# Patient Record
Sex: Female | Born: 1975 | Race: White | Hispanic: No | Marital: Single | Smoking: Never smoker
Health system: Southern US, Community
[De-identification: ages and names within clinical notes are randomized; demographics above are authoritative.]

## PROBLEM LIST (undated history)

## (undated) DIAGNOSIS — F329 Major depressive disorder, single episode, unspecified: Secondary | ICD-10-CM

## (undated) DIAGNOSIS — F419 Anxiety disorder, unspecified: Secondary | ICD-10-CM

## (undated) DIAGNOSIS — I10 Essential (primary) hypertension: Secondary | ICD-10-CM

## (undated) DIAGNOSIS — E7800 Pure hypercholesterolemia, unspecified: Secondary | ICD-10-CM

## (undated) DIAGNOSIS — F32A Depression, unspecified: Secondary | ICD-10-CM

## (undated) HISTORY — PX: CHOLECYSTECTOMY: SHX55

## (undated) HISTORY — PX: TUBAL LIGATION: SHX77

---

## 2004-12-28 ENCOUNTER — Emergency Department (HOSPITAL_COMMUNITY): Admission: EM | Admit: 2004-12-28 | Discharge: 2004-12-28 | Payer: Self-pay | Admitting: Emergency Medicine

## 2005-01-25 ENCOUNTER — Emergency Department (HOSPITAL_COMMUNITY): Admission: EM | Admit: 2005-01-25 | Discharge: 2005-01-26 | Payer: Self-pay | Admitting: Emergency Medicine

## 2005-03-07 ENCOUNTER — Inpatient Hospital Stay (HOSPITAL_COMMUNITY): Admission: AD | Admit: 2005-03-07 | Discharge: 2005-03-07 | Payer: Self-pay | Admitting: Obstetrics and Gynecology

## 2005-03-09 ENCOUNTER — Encounter: Admission: RE | Admit: 2005-03-09 | Discharge: 2005-03-09 | Payer: Self-pay | Admitting: Obstetrics and Gynecology

## 2005-05-22 ENCOUNTER — Emergency Department (HOSPITAL_COMMUNITY): Admission: EM | Admit: 2005-05-22 | Discharge: 2005-05-23 | Payer: Self-pay | Admitting: Emergency Medicine

## 2005-08-31 ENCOUNTER — Encounter: Admission: RE | Admit: 2005-08-31 | Discharge: 2005-08-31 | Payer: Self-pay | Admitting: Obstetrics and Gynecology

## 2005-09-19 ENCOUNTER — Emergency Department (HOSPITAL_COMMUNITY): Admission: EM | Admit: 2005-09-19 | Discharge: 2005-09-20 | Payer: Self-pay | Admitting: *Deleted

## 2006-03-20 ENCOUNTER — Emergency Department (HOSPITAL_COMMUNITY): Admission: EM | Admit: 2006-03-20 | Discharge: 2006-03-20 | Payer: Self-pay | Admitting: Emergency Medicine

## 2011-05-29 ENCOUNTER — Emergency Department (HOSPITAL_COMMUNITY)
Admission: EM | Admit: 2011-05-29 | Discharge: 2011-05-29 | Disposition: A | Discharge status: Home / Self Care | Payer: Self-pay | Attending: Emergency Medicine | Admitting: Emergency Medicine

## 2011-05-29 ENCOUNTER — Emergency Department (HOSPITAL_COMMUNITY): Payer: Self-pay

## 2011-05-29 DIAGNOSIS — E669 Obesity, unspecified: Secondary | ICD-10-CM | POA: Insufficient documentation

## 2011-05-29 DIAGNOSIS — R5381 Other malaise: Secondary | ICD-10-CM | POA: Insufficient documentation

## 2011-05-29 DIAGNOSIS — R209 Unspecified disturbances of skin sensation: Secondary | ICD-10-CM | POA: Insufficient documentation

## 2011-05-29 DIAGNOSIS — E7800 Pure hypercholesterolemia, unspecified: Secondary | ICD-10-CM | POA: Insufficient documentation

## 2011-05-29 DIAGNOSIS — Z6830 Body mass index (BMI) 30.0-30.9, adult: Secondary | ICD-10-CM | POA: Insufficient documentation

## 2011-05-29 DIAGNOSIS — R5383 Other fatigue: Secondary | ICD-10-CM | POA: Insufficient documentation

## 2011-05-29 DIAGNOSIS — R200 Anesthesia of skin: Secondary | ICD-10-CM

## 2011-05-29 DIAGNOSIS — Z79899 Other long term (current) drug therapy: Secondary | ICD-10-CM | POA: Insufficient documentation

## 2011-05-29 DIAGNOSIS — I10 Essential (primary) hypertension: Secondary | ICD-10-CM | POA: Insufficient documentation

## 2011-05-29 HISTORY — DX: Essential (primary) hypertension: I10

## 2011-05-29 HISTORY — DX: Pure hypercholesterolemia, unspecified: E78.00

## 2011-05-29 LAB — URINALYSIS, ROUTINE W REFLEX MICROSCOPIC
Bilirubin Urine: NEGATIVE
Glucose, UA: NEGATIVE mg/dL
Hgb urine dipstick: NEGATIVE
Ketones, ur: NEGATIVE mg/dL
Leukocytes, UA: NEGATIVE
Nitrite: NEGATIVE
Protein, ur: NEGATIVE mg/dL
Specific Gravity, Urine: 1.025 (ref 1.005–1.030)
Urobilinogen, UA: 0.2 mg/dL (ref 0.0–1.0)
pH: 6 (ref 5.0–8.0)

## 2011-05-29 LAB — BASIC METABOLIC PANEL
BUN: 13 mg/dL (ref 6–23)
CO2: 27 mEq/L (ref 19–32)
Calcium: 9.8 mg/dL (ref 8.4–10.5)
Chloride: 100 mEq/L (ref 96–112)
Creatinine, Ser: 0.52 mg/dL (ref 0.50–1.10)
GFR calc Af Amer: >90 mL/min (ref >90)
GFR calc non Af Amer: >90 mL/min (ref >90)
Glucose, Bld: 139 mg/dL — ABNORMAL HIGH (ref 70–99)
Potassium: 3.8 mEq/L (ref 3.5–5.1)
Sodium: 136 mEq/L (ref 135–145)

## 2011-05-29 LAB — CBC
HCT: 35.8 % — ABNORMAL LOW (ref 36.0–46.0)
Hemoglobin: 11.9 g/dL — ABNORMAL LOW (ref 12.0–15.0)
MCH: 29.6 pg (ref 26.0–34.0)
MCHC: 33.2 g/dL (ref 30.0–36.0)
MCV: 89.1 fL (ref 78.0–100.0)
Platelets: 313 10*3/uL (ref 150–400)
RBC: 4.02 MIL/uL (ref 3.87–5.11)
RDW: 13.9 % (ref 11.5–15.5)
WBC: 11.1 10*3/uL — ABNORMAL HIGH (ref 4.0–10.5)

## 2011-05-29 LAB — PREGNANCY, URINE: Preg Test, Ur: NEGATIVE

## 2011-05-29 NOTE — ED Notes (Signed)
C.o left side weakness,numbness for 3 months per pt. Pt ambulated to triage without difficulty. A/o x3. resp even/nonlabored. Face symmetrical.

## 2011-05-29 NOTE — ED Notes (Signed)
Pt refused wheelchair, ambulatory at discharge

## 2011-05-29 NOTE — ED Provider Notes (Signed)
History    35yF with numbness. L face upper and lower extremities and R elbow. Gradual onset about 3 months ago. Persistent. Relatively constant symptoms. No HA. No visual complaints. No fever or chills. No n/v. Denies trauma. No neck pain or stiffness. No rash. No hx of previous similar symptoms.   CSN: [id number]  Arrival date & time 05/29/11  1224   First MD Initiated Contact with Patient 05/29/11 1403      Chief Complaint  Patient presents with  . Leg Pain  . Weakness  . Numbness    (Consider location/radiation/quality/duration/timing/severity/associated sxs/prior treatment) HPI  Past Medical History  Diagnosis Date  . Diabetes mellitus   . Hypertension   . Hypercholesteremia     Past Surgical History  Procedure Date  . Cholecystectomy   . Tubal ligation     No family history on file.  History  Substance Use Topics  . Smoking status: Never Smoker   . Smokeless tobacco: Not on file  . Alcohol Use: No    OB History    Grav Para Term Preterm Abortions TAB SAB Ect Mult Living                  Review of Systems   Review of symptoms negative unless otherwise noted in HPI.   Allergies  Lidocaine  Home Medications   Current Outpatient Rx  Name Route Sig Dispense Refill  . LORATADINE 10 MG PO TABS Oral Take 10 mg by mouth daily.      Marland Kitchen METFORMIN HCL 500 MG PO TABS Oral Take 500 mg by mouth 2 (two) times daily with a meal.      . OLMESARTAN MEDOXOMIL-HCTZ 20-12.5 MG PO TABS Oral Take 1 tablet by mouth daily.      . OMEGA-3-ACID ETHYL ESTERS 1 G PO CAPS Oral Take by mouth 2 (two) times daily.      . SERTRALINE HCL 50 MG PO TABS Oral Take 50 mg by mouth daily.        BP 126/83  Pulse 91  Temp(Src) 98.6 F (37 C) (Oral)  Resp 18  Ht 5\' 4"  (1.626 m)  Wt 280 lb (127.007 kg)  BMI 48.06 kg/m2  SpO2 97%  LMP 05/20/2011  Physical Exam  Nursing note and vitals reviewed. Constitutional: She is oriented to person, place, and time. No distress.   Sitting up in bed. No acute distress. Obese.  HENT:  Head: Normocephalic and atraumatic.  Eyes: Conjunctivae are normal. Right eye exhibits no discharge. Left eye exhibits no discharge.  Neck: Normal range of motion. Neck supple.  Cardiovascular: Normal rate, regular rhythm and normal heart sounds.  Exam reveals no gallop and no friction rub.   No murmur heard. Pulmonary/Chest: Effort normal and breath sounds normal. No respiratory distress.  Abdominal: Soft. She exhibits no distension. There is no tenderness.  Musculoskeletal: She exhibits no edema and no tenderness.  Lymphadenopathy:    She has no cervical adenopathy.  Neurological: She is alert and oriented to person, place, and time. No cranial nerve deficit. She exhibits normal muscle tone. Coordination normal.  Skin: Skin is warm and dry. No rash noted. She is not diaphoretic.  Psychiatric: She has a normal mood and affect. Her behavior is normal. Thought content normal.    ED Course  Procedures (including critical care time)  Labs Reviewed  BASIC METABOLIC PANEL - Abnormal; Notable for the following:    Glucose, Bld 139 (*)    All other components within normal  limits  CBC - Abnormal; Notable for the following:    WBC 11.1 (*)    Hemoglobin 11.9 (*)    HCT 35.8 (*)    All other components within normal limits  URINALYSIS, ROUTINE W REFLEX MICROSCOPIC  PREGNANCY, URINE   Ct Head Wo Contrast  05/29/2011  *RADIOLOGY REPORT*  Clinical Data: Leg pain, weakness  CT HEAD WITHOUT CONTRAST  Technique:  Contiguous axial images were obtained from the base of the skull through the vertex without contrast.  Comparison: None.  Findings: No skull fracture is noted.  The mastoid air cells are unremarkable.  Mild nodular mucosal thickening medial aspect of the left maxillary sinus.  Measures about 6 mm.  No intracranial hemorrhage, mass effect or midline shift.  No acute infarction.  No hydrocephalus.  No intra or extra-axial fluid collection.   The gray and white matter differentiation is preserved.  IMPRESSION: No acute intracranial abnormality.  Mild nodular mucosal thickening medial aspect of the left maxillary sinus.  Original Report Authenticated By: Natasha Mead, M.D.   5:47 PM Discussed findings with pt. Understands need to follow-up. Questions answered to her satisfaction. Requesting work note.  1. Numbness   2. Obesity (BMI 30-39.9)       MDM  35yF with numbness L side and R elbow. Consider CVA, peripheral nerve problem, MS, nutritional, tox.  Distribution not consistent with infarct unless multiple. Possible MS. Less likely tox or nutritional. CT negative for acute process. Feel pt appropriate for outpt fu for further wu.        Raeford Razor, MD 05/29/11 [phone]

## 2013-12-21 ENCOUNTER — Emergency Department (HOSPITAL_COMMUNITY): Payer: Medicaid - Out of State

## 2013-12-21 ENCOUNTER — Encounter (HOSPITAL_COMMUNITY): Payer: Self-pay | Admitting: Emergency Medicine

## 2013-12-21 ENCOUNTER — Emergency Department (HOSPITAL_COMMUNITY)
Admission: EM | Admit: 2013-12-21 | Discharge: 2013-12-22 | Disposition: A | Discharge status: Home / Self Care | Payer: Medicaid - Out of State | Attending: Emergency Medicine | Admitting: Emergency Medicine

## 2013-12-21 DIAGNOSIS — J029 Acute pharyngitis, unspecified: Secondary | ICD-10-CM | POA: Diagnosis not present

## 2013-12-21 DIAGNOSIS — I10 Essential (primary) hypertension: Secondary | ICD-10-CM | POA: Diagnosis not present

## 2013-12-21 DIAGNOSIS — J40 Bronchitis, not specified as acute or chronic: Secondary | ICD-10-CM

## 2013-12-21 DIAGNOSIS — R0602 Shortness of breath: Secondary | ICD-10-CM | POA: Diagnosis not present

## 2013-12-21 DIAGNOSIS — Z79899 Other long term (current) drug therapy: Secondary | ICD-10-CM | POA: Insufficient documentation

## 2013-12-21 DIAGNOSIS — E7800 Pure hypercholesterolemia, unspecified: Secondary | ICD-10-CM | POA: Diagnosis not present

## 2013-12-21 DIAGNOSIS — R05 Cough: Secondary | ICD-10-CM | POA: Insufficient documentation

## 2013-12-21 DIAGNOSIS — H9209 Otalgia, unspecified ear: Secondary | ICD-10-CM | POA: Diagnosis not present

## 2013-12-21 DIAGNOSIS — J209 Acute bronchitis, unspecified: Secondary | ICD-10-CM | POA: Diagnosis not present

## 2013-12-21 DIAGNOSIS — E119 Type 2 diabetes mellitus without complications: Secondary | ICD-10-CM | POA: Diagnosis not present

## 2013-12-21 DIAGNOSIS — R079 Chest pain, unspecified: Secondary | ICD-10-CM | POA: Insufficient documentation

## 2013-12-21 DIAGNOSIS — R059 Cough, unspecified: Secondary | ICD-10-CM | POA: Insufficient documentation

## 2013-12-21 LAB — I-STAT CHEM 8, ED
BUN: 8 mg/dL (ref 6–23)
CHLORIDE: 101 meq/L (ref 96–112)
Calcium, Ion: 1.03 mmol/L — ABNORMAL LOW (ref 1.12–1.23)
Creatinine, Ser: 0.9 mg/dL (ref 0.50–1.10)
GLUCOSE: 127 mg/dL — AB (ref 70–99)
HEMATOCRIT: 33 % — AB (ref 36.0–46.0)
Hemoglobin: 11.2 g/dL — ABNORMAL LOW (ref 12.0–15.0)
POTASSIUM: 3.4 meq/L — AB (ref 3.7–5.3)
Sodium: 135 mEq/L — ABNORMAL LOW (ref 137–147)
TCO2: 24 mmol/L (ref 0–100)

## 2013-12-21 LAB — D-DIMER, QUANTITATIVE: D-Dimer, Quant: 1.82 ug/mL-FEU — ABNORMAL HIGH (ref 0.00–0.48)

## 2013-12-21 MED ORDER — IPRATROPIUM-ALBUTEROL 0.5-2.5 (3) MG/3ML IN SOLN
3.0000 mL | Freq: Once | RESPIRATORY_TRACT | Status: AC
  Administered 2013-12-21: 3 mL via RESPIRATORY_TRACT
  Filled 2013-12-21: qty 3

## 2013-12-21 MED ORDER — ALBUTEROL SULFATE HFA 108 (90 BASE) MCG/ACT IN AERS: 2.0000 | INHALATION_SPRAY | Freq: Four times a day (QID) | RESPIRATORY_TRACT | Status: AC | PRN

## 2013-12-21 MED ORDER — AZITHROMYCIN 250 MG PO TABS: 250.0000 mg | ORAL_TABLET | Freq: Every day | ORAL | Status: AC

## 2013-12-21 MED ORDER — IOHEXOL 350 MG/ML SOLN
100.0000 mL | Freq: Once | INTRAVENOUS | Status: AC | PRN
  Administered 2013-12-21: 100 mL via INTRAVENOUS

## 2013-12-21 NOTE — ED Notes (Signed)
Pt c/o cough with chest soreness x 3 days.

## 2013-12-21 NOTE — ED Notes (Signed)
Patient states that she still feels like when she first presented to ED. States that it hurts when she coughs. States that she is having some pain in upper part of back between shoulder blades on both sides.

## 2013-12-21 NOTE — ED Provider Notes (Signed)
CSN: [id number]     Arrival date & time 12/21/13  2013 History  This chart was scribed for Renee Octave, MD,  by Ashley Jacobs, ED Scribe. The patient was seen in room APA17/AP[facility]17 and the patient's care was started at 8:29 PM.    First MD Initiated Contact with Patient 12/21/13 2028     Chief Complaint  Patient presents with  . Cough     (Consider location/radiation/quality/duration/timing/severity/associated sxs/prior Treatment) Patient is a 38 y.o. female presenting with cough. The history is provided by the patient and medical records. No language interpreter was used.  Cough Associated symptoms: chest pain, ear pain and sore throat   Associated symptoms: no fever    HPI Comments: Renee Vasquez is a 38 y.o. female with hx of HTN, hypercholesteremia, and DM, presents to the Emergency Department complaining of dry cough and SOB, onset three days ago. She has the associated symptom of sore throat and ear pain. Denies fever.  Pt feels dehydrated. She has chest pain with cough. Pt mentions having chest pain that is mostly constant and radiates to the right side of her neck.  Pt had sick contact. She explains that she is unable to eat due to sore throat. Pt is currently using the Mirena as birth control. Pt has never smoke. Denies asthama and bronchitis. She had asthma as a child. Pt has hx of sleep apnea. Pt has tried Dayquil and Nyquil. Past Medical History  Diagnosis Date  . Diabetes mellitus   . Hypertension   . Hypercholesteremia    Past Surgical History  Procedure Laterality Date  . Cholecystectomy    . Tubal ligation     History reviewed. No pertinent family history. History  Substance Use Topics  . Smoking status: Never Smoker   . Smokeless tobacco: Not on file  . Alcohol Use: No   OB History   Grav Para Term Preterm Abortions TAB SAB Ect Mult Living                 Review of Systems  Constitutional: Negative for fever.  HENT: Positive for ear pain, sore throat  and trouble swallowing.   Respiratory: Positive for cough.   Cardiovascular: Positive for chest pain.  Gastrointestinal: Negative for nausea, vomiting and abdominal pain.  Genitourinary: Negative for menstrual problem.  Musculoskeletal: Negative for back pain.  All other systems reviewed and are negative.     Allergies  Lidocaine and Percocet  Home Medications   Prior to Admission medications   Medication Sig Start Date End Date Taking? Authorizing Provider  atorvastatin (LIPITOR) 10 MG tablet Take 10 mg by mouth at bedtime.   Yes Historical Provider, MD  buPROPion (WELLBUTRIN SR) 150 MG 12 hr tablet Take 150 mg by mouth 2 (two) times daily.   Yes Historical Provider, MD  ferrous sulfate 325 (65 FE) MG tablet Take 325 mg by mouth at bedtime.   Yes Historical Provider, MD  hydrOXYzine (VISTARIL) 50 MG capsule Take 100 mg by mouth at bedtime as needed and may repeat dose one time if needed for anxiety or itching (AND/OR SLEEP).   Yes Historical Provider, MD  lisinopril-hydrochlorothiazide (PRINZIDE,ZESTORETIC) 20-25 MG per tablet Take 1 tablet by mouth every morning.   Yes Historical Provider, MD  metFORMIN (GLUCOPHAGE) 500 MG tablet Take 500 mg by mouth 2 (two) times daily with a meal.     Yes Historical Provider, MD  Pseudoeph-Doxylamine-DM-APAP (DAYQUIL/NYQUIL COLD/FLU RELIEF PO) Take 2 capsules by mouth 2 (two) times daily as  needed (FOR COLD AND COUGH SYMPTOMS).   Yes Historical Provider, MD  traZODone (DESYREL) 150 MG tablet Take 50-150 mg by mouth at bedtime as needed for sleep.   Yes Historical Provider, MD  albuterol (PROVENTIL HFA;VENTOLIN HFA) 108 (90 BASE) MCG/ACT inhaler Inhale 2 puffs into the lungs every 6 (six) hours as needed for wheezing or shortness of breath. 12/21/13   Renee OctaveStephen Saamiya Jeppsen, MD  azithromycin (ZITHROMAX) 250 MG tablet Take 1 tablet (250 mg total) by mouth daily. Take first 2 tablets together, then 1 every day until finished. 12/21/13   Renee OctaveStephen Maudene Stotler, MD   BP  151/72  Pulse 55  Temp(Src) 99.3 F (37.4 C) (Oral)  Resp 22  Ht 5' 4.5" (1.638 m)  Wt 280 lb (127.007 kg)  BMI 47.34 kg/m2  SpO2 100% Physical Exam  Nursing note and vitals reviewed. Constitutional: She is oriented to person, place, and time. She appears well-developed and well-nourished. No distress.  HENT:  Head: Normocephalic and atraumatic.  Mouth/Throat: Oropharynx is clear and moist. No oropharyngeal exudate.  Ears: Serous effusion bilaterally  Eyes: Conjunctivae and EOM are normal. Pupils are equal, round, and reactive to light.  Neck: Normal range of motion. Neck supple.  No meningismus.  Cardiovascular: Normal rate, regular rhythm, normal heart sounds and intact distal pulses.   No murmur heard. Pulmonary/Chest: Effort normal. No respiratory distress. She has wheezes (expiratory ). She exhibits no tenderness.  Dry coughs  Abdominal: Soft. There is no tenderness. There is no rebound and no guarding.  Musculoskeletal: Normal range of motion. She exhibits no edema and no tenderness.  Neurological: She is alert and oriented to person, place, and time. No cranial nerve deficit. She exhibits normal muscle tone. Coordination normal.  No ataxia on finger to nose bilaterally. No pronator drift. 5/5 strength throughout. CN 2-12 intact. Negative Romberg. Equal grip strength. Sensation intact. Gait is normal.   Skin: Skin is warm.  Psychiatric: She has a normal mood and affect. Her behavior is normal.    ED Course  Procedures (including critical care time) DIAGNOSTIC STUDIES: Oxygen Saturation is 99% on room air, normal by my interpretation.    COORDINATION OF CARE:  8:33 PM Discussed course of care with pt . Pt understands and agrees.   Labs Review Labs Reviewed  D-DIMER, QUANTITATIVE - Abnormal; Notable for the following:    D-Dimer, Quant 1.82 (*)    All other components within normal limits  I-STAT CHEM 8, ED - Abnormal; Notable for the following:    Sodium 135 (*)     Potassium 3.4 (*)    Glucose, Bld 127 (*)    Calcium, Ion 1.03 (*)    Hemoglobin 11.2 (*)    HCT 33.0 (*)    All other components within normal limits    Imaging Review Dg Chest 2 View  12/21/2013   CLINICAL DATA:  Cough and headaches for 1 week.  EXAM: CHEST  2 VIEW  COMPARISON:  None.  FINDINGS: The heart size and mediastinal contours are within normal limits. Both lungs are clear. The visualized skeletal structures are unremarkable.  IMPRESSION: No active cardiopulmonary disease.   Electronically Signed   By: Burman NievesWilliam  Stevens M.D.   On: 12/21/2013 21:25   Ct Angio Chest Pe W/cm &/or Wo Cm  12/21/2013   CLINICAL DATA:  Chest pain, shortness of breath, and cough.  EXAM: CT ANGIOGRAPHY CHEST WITH CONTRAST  TECHNIQUE: Multidetector CT imaging of the chest was performed using the standard protocol during bolus administration  of intravenous contrast. Multiplanar CT image reconstructions and MIPs were obtained to evaluate the vascular anatomy.  CONTRAST:  OMNIPAQUE IOHEXOL 350 MG/ML SOLN  COMPARISON:  None.  FINDINGS: Technically adequate study with moderately good opacification of the central and segmental pulmonary arteries. No focal filling defects demonstrated. No evidence of significant pulmonary embolus.  Normal heart size. Normal caliber thoracic aorta without evidence of dissection. 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 The esophagus is decompressed. No significant lymphadenopathy in the chest. Visualization of the lungs is limited due to motion artifact but no gross focal consolidation or airspace disease is identified. No pleural effusion. No pneumothorax.  Visualized portions of the upper abdominal organs demonstrate diffuse fatty infiltration and associated enlargement of the liver. No destructive bone lesions appreciated.  Review of the MIP images confirms the above findings.  IMPRESSION:  No evidence of significant pulmonary embolus.   Electronically Signed   By: Burman Nieves M.D.   On: 12/21/2013 23:30     EKG Interpretation   Date/Time:  Monday December 21 2013 20:48:49 EDT Ventricular Rate:  99 PR Interval:  161 QRS Duration: 97 QT Interval:  340 QTC Calculation: 436 R Axis:   40 Text Interpretation:  Sinus tachycardia Paired ventricular premature  complexes Abnormal R-wave progression, early transition Borderline T wave  abnormalities No previous ECGs available Confirmed by Tamyia Minich  MD, Ellisha Bankson  [phone]) on 12/21/2013 9:24:04 PM      MDM   Final diagnoses:  Bronchitis   3 history dry cough, shortness of breath, chest soreness with coughing. No fever. No sick contacts. Does not smoke.  No distress. Scattered expiratory wheezing. No hypoxia. Chest x-ray is clear. D-dimer elevated.  CTPE negative.  We'll treat for bronchitis with azithromycin, bronchodilators. Followup with PCP.   I personally performed the services described in this documentation, which was scribed in my presence. The recorded information has been reviewed and is accurate.     Renee Octave, MD 12/22/13 [phone]

## 2013-12-21 NOTE — Discharge Instructions (Signed)

## 2017-07-12 ENCOUNTER — Encounter (HOSPITAL_COMMUNITY): Payer: Self-pay | Admitting: Emergency Medicine

## 2017-07-12 ENCOUNTER — Emergency Department (HOSPITAL_COMMUNITY): Payer: Medicaid - Out of State

## 2017-07-12 ENCOUNTER — Other Ambulatory Visit: Payer: Self-pay

## 2017-07-12 ENCOUNTER — Emergency Department (HOSPITAL_COMMUNITY)
Admission: EM | Admit: 2017-07-12 | Discharge: 2017-07-13 | Disposition: A | Discharge status: Home / Self Care | Payer: Medicaid - Out of State | Attending: Emergency Medicine | Admitting: Emergency Medicine

## 2017-07-12 DIAGNOSIS — M533 Sacrococcygeal disorders, not elsewhere classified: Secondary | ICD-10-CM | POA: Diagnosis not present

## 2017-07-12 DIAGNOSIS — M4696 Unspecified inflammatory spondylopathy, lumbar region: Secondary | ICD-10-CM | POA: Insufficient documentation

## 2017-07-12 DIAGNOSIS — E119 Type 2 diabetes mellitus without complications: Secondary | ICD-10-CM | POA: Diagnosis not present

## 2017-07-12 DIAGNOSIS — Z79899 Other long term (current) drug therapy: Secondary | ICD-10-CM | POA: Diagnosis not present

## 2017-07-12 DIAGNOSIS — M47816 Spondylosis without myelopathy or radiculopathy, lumbar region: Secondary | ICD-10-CM

## 2017-07-12 DIAGNOSIS — R200 Anesthesia of skin: Secondary | ICD-10-CM | POA: Diagnosis not present

## 2017-07-12 DIAGNOSIS — M461 Sacroiliitis, not elsewhere classified: Secondary | ICD-10-CM

## 2017-07-12 DIAGNOSIS — Z7984 Long term (current) use of oral hypoglycemic drugs: Secondary | ICD-10-CM | POA: Diagnosis not present

## 2017-07-12 DIAGNOSIS — I10 Essential (primary) hypertension: Secondary | ICD-10-CM | POA: Insufficient documentation

## 2017-07-12 DIAGNOSIS — M47818 Spondylosis without myelopathy or radiculopathy, sacral and sacrococcygeal region: Secondary | ICD-10-CM

## 2017-07-12 HISTORY — DX: Anxiety disorder, unspecified: F41.9

## 2017-07-12 HISTORY — DX: Depression, unspecified: F32.A

## 2017-07-12 HISTORY — DX: Major depressive disorder, single episode, unspecified: F32.9

## 2017-07-12 MED ORDER — IBUPROFEN 800 MG PO TABS
800.0000 mg | ORAL_TABLET | Freq: Once | ORAL | Status: DC
  Filled 2017-07-12: qty 1

## 2017-07-12 MED ORDER — ACETAMINOPHEN 325 MG PO TABS
650.0000 mg | ORAL_TABLET | Freq: Once | ORAL | Status: AC
  Administered 2017-07-12: 650 mg via ORAL
  Filled 2017-07-12: qty 2

## 2017-07-12 NOTE — ED Provider Notes (Signed)
[facility] EMERGENCY DEPARTMENT Provider Note   CSN: [id number] Arrival date & time: 07/12/17  2049     History   Chief Complaint Chief Complaint  Patient presents with  . Back Pain    HPI Renee Vasquez is a 42 y.o. female.  PCP: [facility].   The history is provided by the patient.  Back Pain   This is a new problem. The current episode started 12 to 24 hours ago. The problem occurs hourly. The problem has been gradually worsening. The pain is associated with no known injury. The pain is present in the lumbar spine. The quality of the pain is described as burning and aching. Radiates to: right buttock. The pain is moderate. Exacerbated by: standing. The pain is the same all the time. Associated symptoms include numbness. Pertinent negatives include no chest pain, no fever, no abdominal pain, no bowel incontinence, no perianal numbness, no bladder incontinence and no dysuria. She has tried nothing for the symptoms.    Past Medical History:  Diagnosis Date  . Anxiety   . Depression   . Diabetes mellitus   . Hypercholesteremia   . Hypertension     There are no active problems to display for this patient.   Past Surgical History:  Procedure Laterality Date  . CHOLECYSTECTOMY    . TUBAL LIGATION      OB History    No data available       Home Medications    Prior to Admission medications   Medication Sig Start Date End Date Taking? Authorizing Provider  albuterol (PROVENTIL HFA;VENTOLIN HFA) 108 (90 BASE) MCG/ACT inhaler Inhale 2 puffs into the lungs every 6 (six) hours as needed for wheezing or shortness of breath. 12/21/13   Rancour, Jeannett Senior, MD  atorvastatin (LIPITOR) 10 MG tablet Take 10 mg by mouth at bedtime.    [provider]  azithromycin (ZITHROMAX) 250 MG tablet Take 1 tablet (250 mg total) by mouth daily. Take first 2 tablets together, then 1 every day until finished. 12/21/13   Rancour, Jeannett Senior, MD  buPROPion  (WELLBUTRIN SR) 150 MG 12 hr tablet Take 150 mg by mouth 2 (two) times daily.    [provider]  ferrous sulfate 325 (65 FE) MG tablet Take 325 mg by mouth at bedtime.    [provider]  hydrOXYzine (VISTARIL) 50 MG capsule Take 100 mg by mouth at bedtime as needed and may repeat dose one time if needed for anxiety or itching (AND/OR SLEEP).    [provider]  lisinopril-hydrochlorothiazide (PRINZIDE,ZESTORETIC) 20-25 MG per tablet Take 1 tablet by mouth every morning.    [provider]  metFORMIN (GLUCOPHAGE) 500 MG tablet Take 500 mg by mouth 2 (two) times daily with a meal.      [provider]  Pseudoeph-Doxylamine-DM-APAP (DAYQUIL/NYQUIL COLD/FLU RELIEF PO) Take 2 capsules by mouth 2 (two) times daily as needed (FOR COLD AND COUGH SYMPTOMS).    [provider]  traZODone (DESYREL) 150 MG tablet Take 50-150 mg by mouth at bedtime as needed for sleep.    [provider]    Family History No family history on file.  Social History Social History   Tobacco Use  . Smoking status: Never Smoker  . Smokeless tobacco: Never Used  Substance Use Topics  . Alcohol use: No  . Drug use: No     Allergies   Lidocaine; Lisinopril; and Percocet [oxycodone-acetaminophen]   Review of Systems Review of  Systems  Constitutional: Negative for activity change and fever.       All ROS Neg except as noted in HPI  HENT: Negative for nosebleeds.   Eyes: Negative for photophobia and discharge.  Respiratory: Negative for cough, shortness of breath and wheezing.   Cardiovascular: Negative for chest pain and palpitations.  Gastrointestinal: Negative for abdominal pain, blood in stool and bowel incontinence.  Genitourinary: Negative for bladder incontinence, dysuria, frequency and hematuria.  Musculoskeletal: Positive for back pain. Negative for arthralgias and neck pain.  Skin: Negative.   Neurological: Positive for numbness. Negative for  dizziness, seizures and speech difficulty.  Psychiatric/Behavioral: Negative for confusion and hallucinations.     Physical Exam Updated Vital Signs BP 131/73 (BP Location: Right Arm)   Pulse 80   Temp 98.1 F (36.7 C) (Oral)   Resp 16   Ht 5\' 4"  (1.626 m)   Wt 131.1 kg (289 lb)   SpO2 98%   BMI 49.61 kg/m   Physical Exam  Constitutional: She is oriented to person, place, and time. She appears well-developed and well-nourished.  Non-toxic appearance.  HENT:  Head: Normocephalic.  Right Ear: Tympanic membrane and external ear normal.  Left Ear: Tympanic membrane and external ear normal.  Eyes: EOM and lids are normal. Pupils are equal, round, and reactive to light.  Neck: Normal range of motion. Neck supple. Carotid bruit is not present.  Cardiovascular: Normal rate, regular rhythm, normal heart sounds, intact distal pulses and normal pulses.  Pulmonary/Chest: Breath sounds normal. No respiratory distress.  Abdominal: Soft. Bowel sounds are normal. There is no tenderness. There is no guarding.  Musculoskeletal:       Lumbar back: She exhibits pain and spasm.       Back:  Lymphadenopathy:       Head (right side): No submandibular adenopathy present.       Head (left side): No submandibular adenopathy present.    She has no cervical adenopathy.  Neurological: She is alert and oriented to person, place, and time. She has normal strength. No cranial nerve deficit or sensory deficit.  Skin: Skin is warm and dry.  Psychiatric: She has a normal mood and affect. Her speech is normal.  Nursing note and vitals reviewed.    ED Treatments / Results  Labs (all labs ordered are listed, but only abnormal results are displayed) Labs Reviewed - No data to display  EKG  EKG Interpretation None       Radiology No results found.  Procedures Procedures (including critical care time)  Medications Ordered in ED Medications - No data to display   Initial Impression /  Assessment and Plan / ED Course  I have reviewed the triage vital signs and the nursing notes.  Pertinent labs & imaging results that were available during my care of the patient were reviewed by me and considered in my medical decision making (see chart for details).       Final Clinical Impressions(s) / ED Diagnoses MDM No gross neurologic or vascular deficit appreciated. Will obtain CT scan of the lumbar spine.  Tylenol and ibuprofen offered to the patient for assistance with her discomfort.  Patient states she had some ibuprofen earlier and it did not help.  CT scan shows no fracture or malalignment.  There are degenerative changes of the lumbar spine.  There is severe facet arthropathy on the right at the L5-S1 area.  There is no canal stenosis.  There is mild left L3-L4 neural foraminal narrowing.  There is also moderate sacroiliac osteoarthritis.  I discussed these findings with the patient in terms which she understands.  Patient is given prescription for Flexeril and ibuprofen.  I have  asked her to use the heating pad.  Patient is referred to orthopedics for additional evaluation and management.   Final diagnoses:  Lumbar facet arthropathy  Osteoarthritis of right sacroiliac joint    ED Discharge Orders    None       Ivery QualeBryant, Bobbi Yount, PA-C 07/13/17 [phone]    Bethann BerkshireZammit, Joseph, MD 07/14/17 1041

## 2017-07-12 NOTE — ED Triage Notes (Signed)
Pt C/O lower right sided back pain with numbness and burning that started this morning.

## 2017-07-12 NOTE — ED Notes (Signed)
PT states she is not pregnant and states she would like to the CT and not be tested for pregnancy

## 2017-07-13 MED ORDER — HYDROCODONE-ACETAMINOPHEN 5-325 MG PO TABS: 1.0000 | ORAL_TABLET | ORAL | 0 refills | Status: AC | PRN

## 2017-07-13 MED ORDER — CYCLOBENZAPRINE HCL 10 MG PO TABS: 10.0000 mg | ORAL_TABLET | Freq: Three times a day (TID) | ORAL | 0 refills | Status: AC

## 2017-07-13 MED ORDER — IBUPROFEN 600 MG PO TABS: 600.0000 mg | ORAL_TABLET | Freq: Four times a day (QID) | ORAL | 0 refills | Status: AC

## 2017-07-13 NOTE — Discharge Instructions (Signed)
The neurologic and vascular exam are well within normal limits.  The CT scan shows a severe right facet arthropathy (wearing out of the joints behind the vertebrae) at the L5-S1 area.  There is also noted some arthritis in the sacroiliac joints.  Please see Dr. Victorino DikeHewitt, or the orthopedic specialist of your choice as soon as possible for assistance with this problem.  May use Norco every 4 hours if needed tonight.  Please use Flexeril 3 times daily, use ibuprofen with breakfast, lunch, dinner, and at bedtime.  Heating pad to your back may be helpful.

## 2017-07-15 MED FILL — Hydrocodone-Acetaminophen Tab 5-325 MG: ORAL | Qty: 6 | Status: AC

## 2017-09-11 ENCOUNTER — Emergency Department (HOSPITAL_COMMUNITY)
Admission: EM | Admit: 2017-09-11 | Discharge: 2017-09-11 | Disposition: A | Discharge status: Home / Self Care | Payer: Medicaid - Out of State | Attending: Emergency Medicine | Admitting: Emergency Medicine

## 2017-09-11 ENCOUNTER — Emergency Department (HOSPITAL_COMMUNITY): Payer: Medicaid - Out of State

## 2017-09-11 ENCOUNTER — Encounter (HOSPITAL_COMMUNITY): Payer: Self-pay | Admitting: Emergency Medicine

## 2017-09-11 ENCOUNTER — Other Ambulatory Visit: Payer: Self-pay

## 2017-09-11 DIAGNOSIS — Z7984 Long term (current) use of oral hypoglycemic drugs: Secondary | ICD-10-CM | POA: Insufficient documentation

## 2017-09-11 DIAGNOSIS — E119 Type 2 diabetes mellitus without complications: Secondary | ICD-10-CM | POA: Insufficient documentation

## 2017-09-11 DIAGNOSIS — N3000 Acute cystitis without hematuria: Secondary | ICD-10-CM | POA: Diagnosis not present

## 2017-09-11 DIAGNOSIS — I10 Essential (primary) hypertension: Secondary | ICD-10-CM | POA: Diagnosis not present

## 2017-09-11 DIAGNOSIS — Z79899 Other long term (current) drug therapy: Secondary | ICD-10-CM | POA: Insufficient documentation

## 2017-09-11 DIAGNOSIS — R55 Syncope and collapse: Secondary | ICD-10-CM | POA: Diagnosis present

## 2017-09-11 DIAGNOSIS — R42 Dizziness and giddiness: Secondary | ICD-10-CM

## 2017-09-11 LAB — URINALYSIS, ROUTINE W REFLEX MICROSCOPIC
Bilirubin Urine: NEGATIVE
Glucose, UA: NEGATIVE mg/dL
HGB URINE DIPSTICK: NEGATIVE
Ketones, ur: NEGATIVE mg/dL
Nitrite: NEGATIVE
Protein, ur: NEGATIVE mg/dL
SPECIFIC GRAVITY, URINE: 1.011 (ref 1.005–1.030)
pH: 5 (ref 5.0–8.0)

## 2017-09-11 LAB — CBC
HCT: 33.4 % — ABNORMAL LOW (ref 36.0–46.0)
Hemoglobin: 11 g/dL — ABNORMAL LOW (ref 12.0–15.0)
MCH: 30.7 pg (ref 26.0–34.0)
MCHC: 32.9 g/dL (ref 30.0–36.0)
MCV: 93.3 fL (ref 78.0–100.0)
PLATELETS: 305 10*3/uL (ref 150–400)
RBC: 3.58 MIL/uL — AB (ref 3.87–5.11)
RDW: 14.1 % (ref 11.5–15.5)
WBC: 10.5 10*3/uL (ref 4.0–10.5)

## 2017-09-11 LAB — BASIC METABOLIC PANEL
Anion gap: 13 (ref 5–15)
BUN: 20 mg/dL (ref 6–20)
CO2: 26 mmol/L (ref 22–32)
CREATININE: 1.59 mg/dL — AB (ref 0.44–1.00)
Calcium: 8.6 mg/dL — ABNORMAL LOW (ref 8.9–10.3)
Chloride: 97 mmol/L — ABNORMAL LOW (ref 101–111)
GFR calc non Af Amer: 39 mL/min — ABNORMAL LOW (ref >60)
GFR, EST AFRICAN AMERICAN: 45 mL/min — AB (ref >60)
Glucose, Bld: 162 mg/dL — ABNORMAL HIGH (ref 65–99)
Potassium: 3.8 mmol/L (ref 3.5–5.1)
SODIUM: 136 mmol/L (ref 135–145)

## 2017-09-11 LAB — TROPONIN I

## 2017-09-11 MED ORDER — SULFAMETHOXAZOLE-TRIMETHOPRIM 800-160 MG PO TABS
1.0000 | ORAL_TABLET | Freq: Once | ORAL | Status: AC
  Administered 2017-09-11: 1 via ORAL
  Filled 2017-09-11: qty 1

## 2017-09-11 MED ORDER — ONDANSETRON HCL 4 MG/2ML IJ SOLN
4.0000 mg | Freq: Once | INTRAMUSCULAR | Status: AC
  Administered 2017-09-11: 4 mg via INTRAVENOUS
  Filled 2017-09-11: qty 2

## 2017-09-11 MED ORDER — SODIUM CHLORIDE 0.9 % IV BOLUS
1000.0000 mL | Freq: Once | INTRAVENOUS | Status: AC
  Administered 2017-09-11: 1000 mL via INTRAVENOUS

## 2017-09-11 MED ORDER — SULFAMETHOXAZOLE-TRIMETHOPRIM 800-160 MG PO TABS: 1.0000 | ORAL_TABLET | Freq: Two times a day (BID) | ORAL | 0 refills | Status: AC

## 2017-09-11 NOTE — ED Provider Notes (Addendum)
[name] EMERGENCY DEPARTMENT Provider Note   CSN: [id number] Arrival date & time: 09/11/17  1755     History   Chief Complaint Chief Complaint  Patient presents with  . Chest Pain    HPI Renee Vasquez is a 42 y.o. female.  HPI  The patient is a 42 year old female who reports a history of high blood pressure, high cholesterol, diabetes, anxiety and depression.  She has been evaluated multiple times in the past for episodes similar to what occurred today.  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  She was nauseated with this and felt generally weak.  This is happened several times this week and coincides with starting Victoza medication.  She has had these episodes very intermittently in the past and in fact several years ago was admitted to an outside hospital where she was found to have some abnormal tests, she had a heart catheterization that was ultimately performed and she reports that she was told she had normal coronary arteries.  She has not had any heart problems that she knows of, she has not on blood thinners, she has not been having any rectal bleeding but has had some intermittent diarrhea.  She denies dysuria, denies abdominal pain, she has had some coughing over the last week which is intermittent and similar to 2 of her roommates.  She is unsure if she is having any fevers, she denies any tick bites, insect bites, rashes or swelling of her legs that is not chronic.  Electronic medical record reviewed and shows that the patient actually had a nuclear stress test in May 2017 which showed no signs of ischemia  Past Medical History:  Diagnosis Date  . Anxiety   . Depression   . Diabetes mellitus   . Hypercholesteremia   . Hypertension     There are no active problems to display for this  patient.   Past Surgical History:  Procedure Laterality Date  . CHOLECYSTECTOMY    . TUBAL LIGATION       OB History   None      Home Medications    Prior to Admission medications   Medication Sig Start Date End Date Taking? Authorizing Provider  albuterol (PROVENTIL HFA;VENTOLIN HFA) 108 (90 BASE) MCG/ACT inhaler Inhale 2 puffs into the lungs every 6 (six) hours as needed for wheezing or shortness of breath. 12/21/13   Rancour, Jeannett Senior, MD  atorvastatin (LIPITOR) 10 MG tablet Take 10 mg by mouth at bedtime.    [provider]  azithromycin (ZITHROMAX) 250 MG tablet Take 1 tablet (250 mg total) by mouth daily. Take first 2 tablets together, then 1 every day until finished. 12/21/13   Rancour, Jeannett Senior, MD  buPROPion (WELLBUTRIN SR) 150 MG 12 hr tablet Take 150 mg by mouth 2 (two) times daily.    [provider]  cyclobenzaprine (FLEXERIL) 10 MG tablet Take 1 tablet (10 mg total) by mouth 3 (three) times daily. 07/13/17   Ivery Quale, PA-C  ferrous sulfate 325 (65 FE) MG tablet Take 325 mg by mouth at bedtime.    [provider]  HYDROcodone-acetaminophen (NORCO/VICODIN) 5-325 MG tablet Take 1-2 tablets by mouth every 4 (four) hours as needed. 07/13/17   Ivery Quale, PA-C  hydrOXYzine (VISTARIL) 50 MG capsule Take 100 mg by  mouth at bedtime as needed and may repeat dose one time if needed for anxiety or itching (AND/OR SLEEP).    [provider]  ibuprofen (ADVIL,MOTRIN) 600 MG tablet Take 1 tablet (600 mg total) by mouth 4 (four) times daily. 07/13/17   Ivery Quale, PA-C  lisinopril-hydrochlorothiazide (PRINZIDE,ZESTORETIC) 20-25 MG per tablet Take 1 tablet by mouth every morning.    [provider]  metFORMIN (GLUCOPHAGE) 500 MG tablet Take 500 mg by mouth 2 (two) times daily with a meal.      [provider]  Pseudoeph-Doxylamine-DM-APAP (DAYQUIL/NYQUIL COLD/FLU RELIEF PO) Take 2 capsules by mouth 2 (two) times daily as needed  (FOR COLD AND COUGH SYMPTOMS).    [provider]  traZODone (DESYREL) 150 MG tablet Take 50-150 mg by mouth at bedtime as needed for sleep.    [provider]    Family History No family history on file.  Social History Social History   Tobacco Use  . Smoking status: Never Smoker  . Smokeless tobacco: Never Used  Substance Use Topics  . Alcohol use: No  . Drug use: No     Allergies   Lidocaine; Lisinopril; and Percocet [oxycodone-acetaminophen]   Review of Systems Review of Systems  All other systems reviewed and are negative.    Physical Exam Updated Vital Signs BP (!) 95/50   Pulse 66   Temp 98.9 F (37.2 C) (Oral)   Resp 14   Wt 131.1 kg (289 lb)   SpO2 97%   BMI 49.61 kg/m   Physical Exam  Constitutional: She appears well-developed and well-nourished. No distress.  HENT:  Head: Normocephalic and atraumatic.  Mouth/Throat: Oropharynx is clear and moist. No oropharyngeal exudate.  Eyes: Pupils are equal, round, and reactive to light. Conjunctivae and EOM are normal. Right eye exhibits no discharge. Left eye exhibits no discharge. No scleral icterus.  Neck: Normal range of motion. Neck supple. No JVD present. No thyromegaly present.  Cardiovascular: Normal rate, regular rhythm, normal heart sounds and intact distal pulses. Exam reveals no gallop and no friction rub.  No murmur heard. Pulmonary/Chest: Effort normal and breath sounds normal. No respiratory distress. She has no wheezes. She has no rales.  Abdominal: Soft. Bowel sounds are normal. She exhibits no distension and no mass. There is no tenderness.  Musculoskeletal: Normal range of motion. She exhibits no edema or tenderness.  Lymphadenopathy:    She has no cervical adenopathy.  Neurological: She is alert. Coordination normal.  Skin: Skin is warm and dry. No rash noted. No erythema.  Psychiatric: She has a normal mood and affect. Her behavior is normal.  Nursing note and vitals  reviewed.    ED Treatments / Results  Labs (all labs ordered are listed, but only abnormal results are displayed) Labs Reviewed  BASIC METABOLIC PANEL - Abnormal; Notable for the following components:      Result Value   Chloride 97 (*)    Glucose, Bld 162 (*)    Creatinine, Ser 1.59 (*)    Calcium 8.6 (*)    GFR calc non Af Amer 39 (*)    GFR calc Af Amer 45 (*)    All other components within normal limits  CBC - Abnormal; Notable for the following components:   RBC 3.58 (*)    Hemoglobin 11.0 (*)    HCT 33.4 (*)    All other components within normal limits  TROPONIN I  URINALYSIS, ROUTINE W REFLEX MICROSCOPIC    EKG EKG Interpretation  Date/Time:  Wednesday September 11 2017 18:17:35 EDT Ventricular Rate:  80 PR Interval:  160 QRS Duration: 94 QT Interval:  396 QTC Calculation: 456 R Axis:   62 Text Interpretation:  Normal sinus rhythm Nonspecific T wave abnormality Abnormal ECG Since last tracing rate slower Confirmed by Eber HongMiller, Davida Falconi ([id number]) on 09/11/2017 7:01:29 PM   Radiology Dg Chest 2 View  Result Date: 09/11/2017 CLINICAL DATA:  Chest pain EXAM: CHEST - 2 VIEW COMPARISON:  12/21/2013 FINDINGS: The heart size and mediastinal contours are within normal limits. Both lungs are clear. The visualized skeletal structures are unremarkable. IMPRESSION: No active cardiopulmonary disease. Electronically Signed   By: Marlan Palauharles  Clark M.D.   On: 09/11/2017 18:53    Procedures Procedures (including critical care time)  Medications Ordered in ED Medications  ondansetron [facility](ZOFRAN) injection 4 mg (4 mg Intravenous Given 09/11/17 2039)  sodium chloride 0.9 % bolus 1,000 mL (0 mLs Intravenous Stopped 09/11/17 2122)     Initial Impression / Assessment and Plan / ED Course  I have reviewed the triage vital signs and the nursing notes.  Pertinent labs & imaging results that were available during my care of the patient were reviewed by me and considered in my medical decision making  (see chart for details).  Clinical Course as of Sep 12 2218  Wed Sep 11, 2017  2218 UA with possible UTI - bactrim started - culture added   [BM]    Clinical Course User Index [BM] Eber HongMiller, Racquel Arkin, MD   The patient appears very comfortable, she does have mild bilateral swelling of her legs but is very obese.  She has a soft nontender abdomen, clear heart and lung sounds and there is no tachycardia hypoxia or abnormal heart sounds.  She has normal pulses, she appears well, her labs are also unremarkable with a creatinine of 1.6, a BUN of 28, when compared to labs from 3 years ago this is new with a creatinine of 0.9 at that time.  As the patient is both hypertensive and diabetic at baseline she has multiple reasons to have some renal insufficiency, she does not feel dehydrated and has been drinking plenty of fluids however I will give her more oral fluids to drink here.  This will need to be checked as an outpatient to make sure the creatinine is not getting worse.  Troponin negative, urinalysis pending, patient agreeable to the plan  zofran given  Labs show the renal insufficiency, patient updated, otherwise the patient's workup has been unremarkable, I have asked her to stop taking Victoza, she is expressed understanding, at this time the patient is stable for discharge and will follow-up this week for recheck on her renal function and medication management  Urinalysis shows that the patient has a possible infection, culture added, Bactrim started, patient informed and stable for discharge  Final Clinical Impressions(s) / ED Diagnoses   Final diagnoses:  Light headed  Near syncope    ED Discharge Orders    None       Eber HongMiller, Takya Vandivier, MD 09/11/17 2145    Eber HongMiller, Klyn Kroening, MD 09/11/17 2220

## 2017-09-11 NOTE — Discharge Instructions (Addendum)
Your testing today has shown that you have some slight kidney dysfunction, this will need to be rechecked within the next week.  Please drink plenty of fluids, stay out of work for the next 2 days and hydrate at home.  You will need to be rechecked within 48 hours at your doctor's office if no better, emergency department if symptoms worsen.

## 2017-09-11 NOTE — ED Notes (Signed)
ED Provider at bedside. 

## 2017-09-11 NOTE — ED Triage Notes (Signed)
Pt c/o of chest pain, n/d, dizzy, cough and headache x 5 days.

## 2017-09-13 LAB — URINE CULTURE

## 2018-10-07 IMAGING — CT CT L SPINE W/O CM
3 series · 11 of 33 positions shown, 13 images · non-contrast
Comparison: None.

CLINICAL DATA: Sharp intermittent RIGHT low back pain with burning
sensation beginning tonight. Pain radiating to RIGHT buttock.

EXAM:
CT LUMBAR SPINE WITHOUT CONTRAST
TECHNIQUE: Multidetector CT imaging of the lumbar spine was performed without
intravenous contrast administration. Multiplanar CT image
reconstructions were also generated.

[Series 4: l spine soft · axial · 0.38mm/px · z∈[+1257,+1433]mm · 3 of 144 slices shown, 4 images]
[im 34/144  soft-tissue]
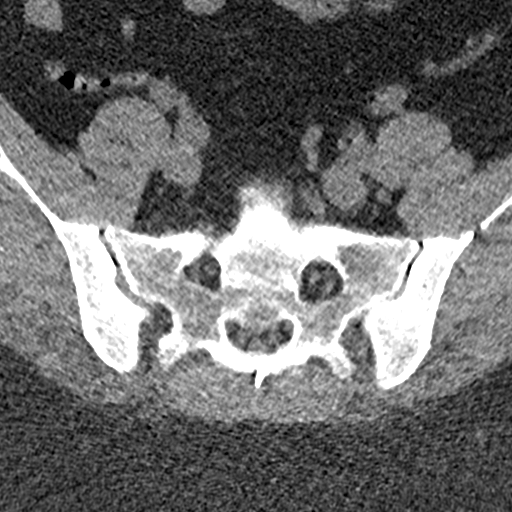
[im 34/144  bone]
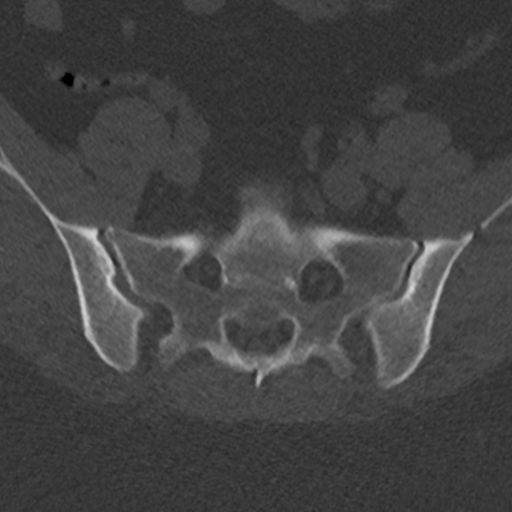
[im 78/144  bone]
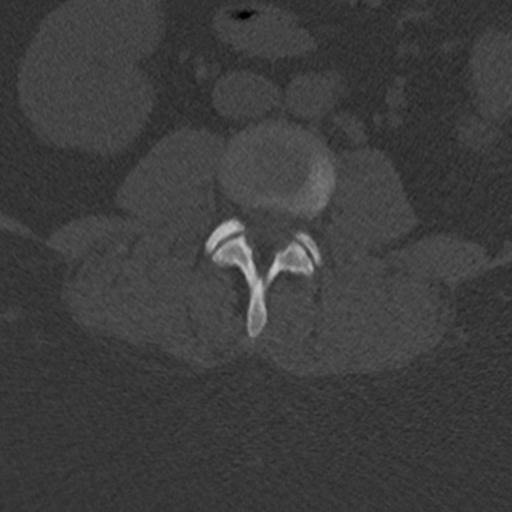
[im 122/144  bone]
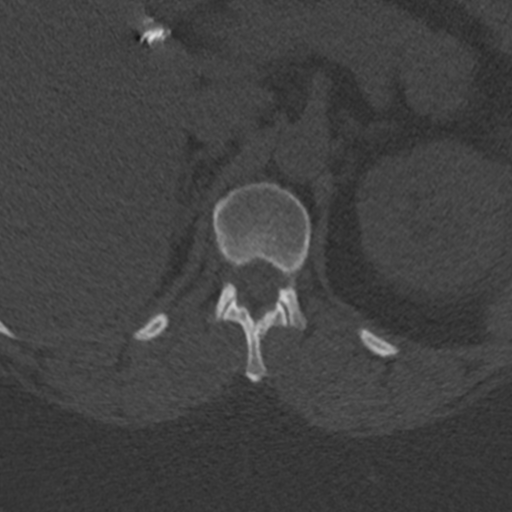

[Series 5: sagittal bone · sagittal · 0.30mm/px · 5 of 95 slices shown, 6 images]
[im 32/95  bone]
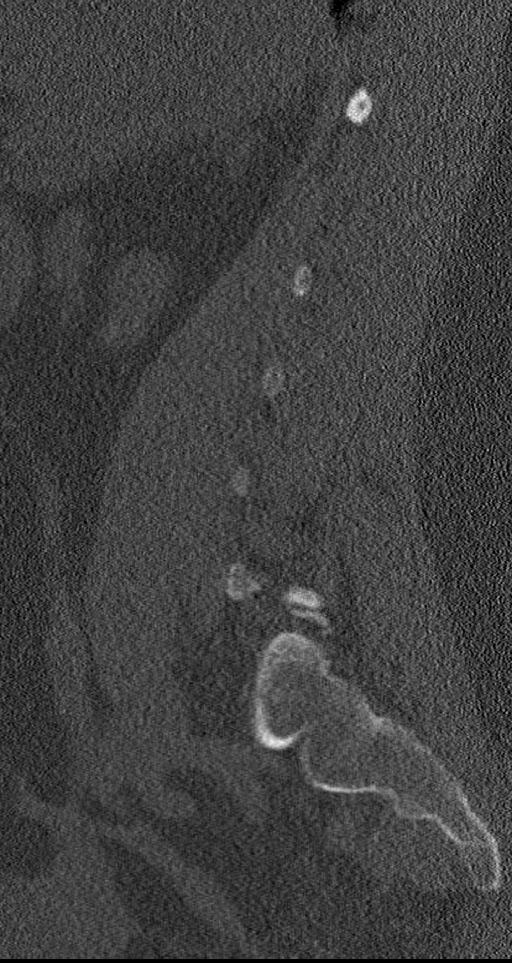
[im 40/95  bone]
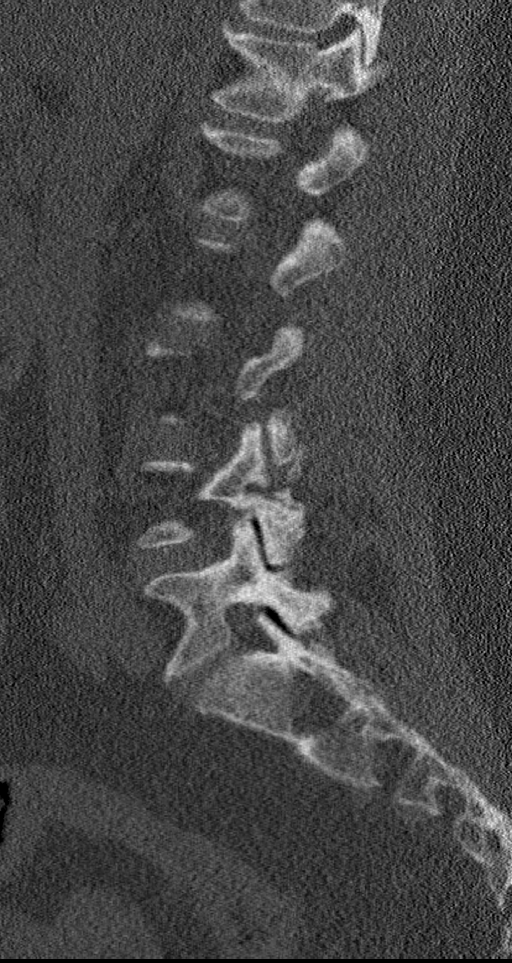
[im 48/95  soft-tissue]
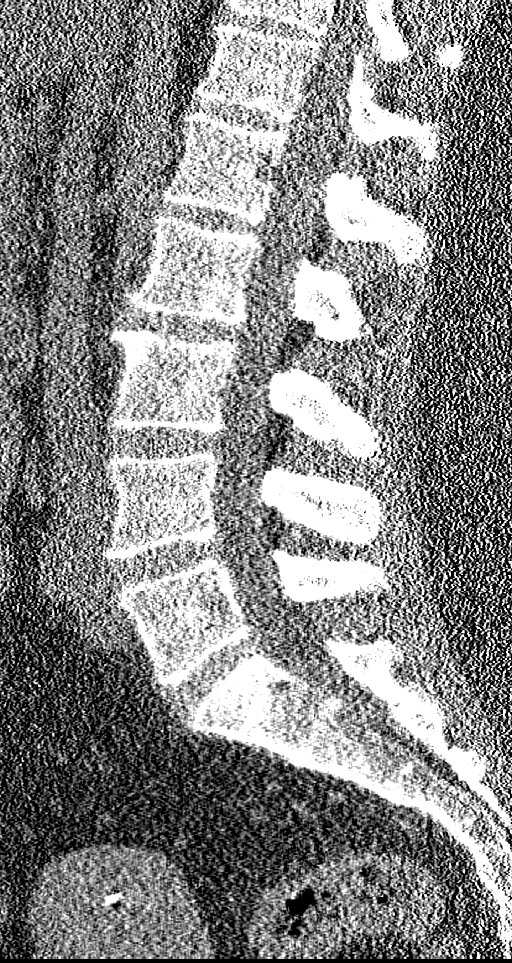
[im 48/95  bone]
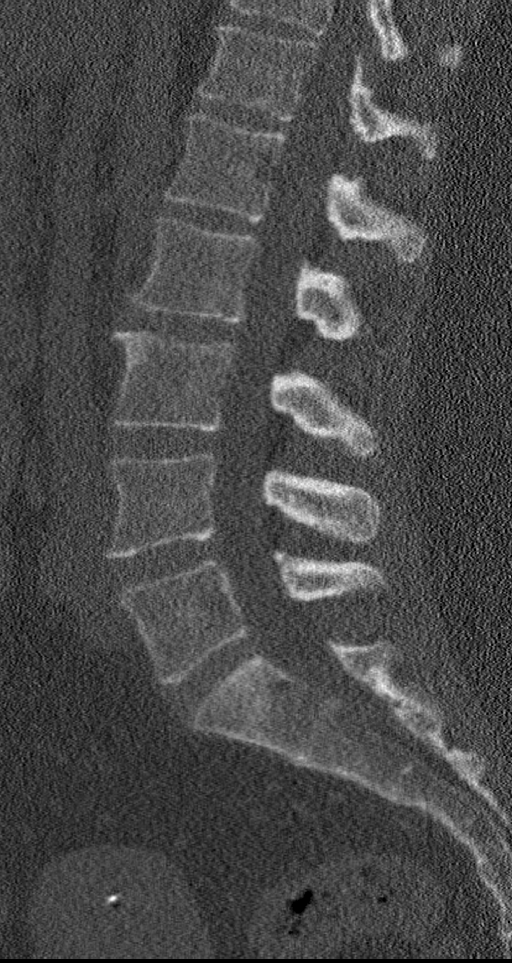
[im 55/95  bone]
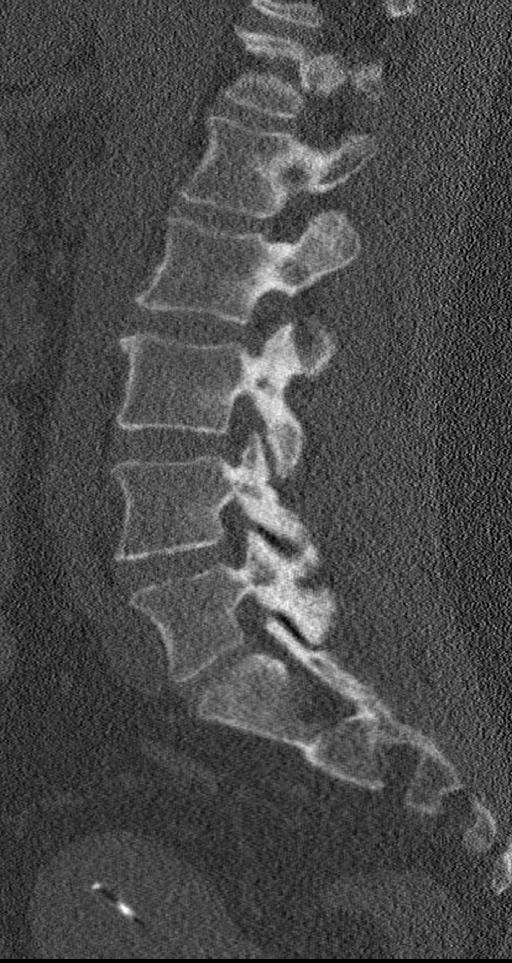
[im 63/95  bone]
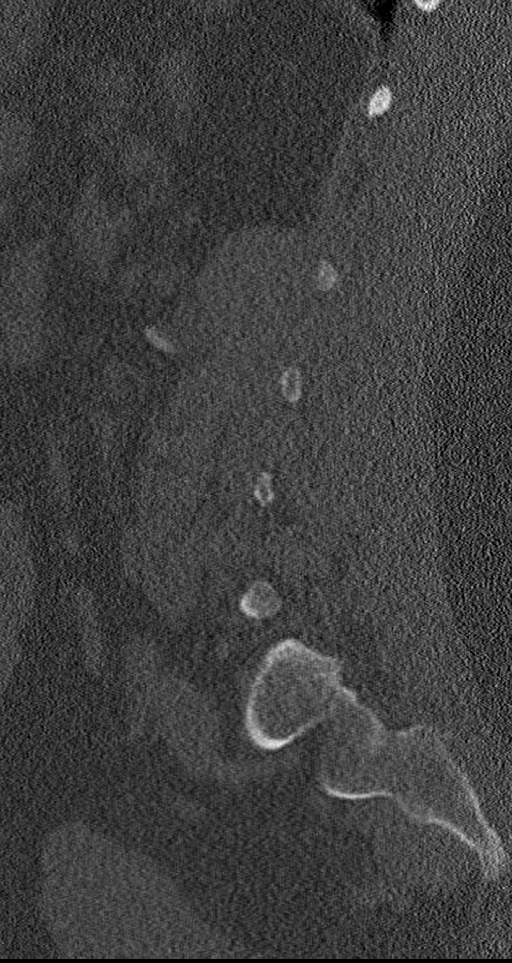

[Series 6: coronal bone · coronal · 0.42mm/px · 3 of 77 slices shown]
[im 16/77  bone]
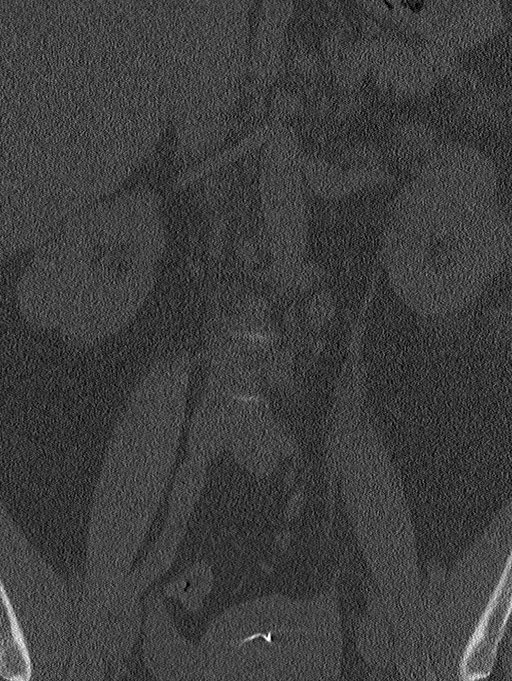
[im 31/77  bone]
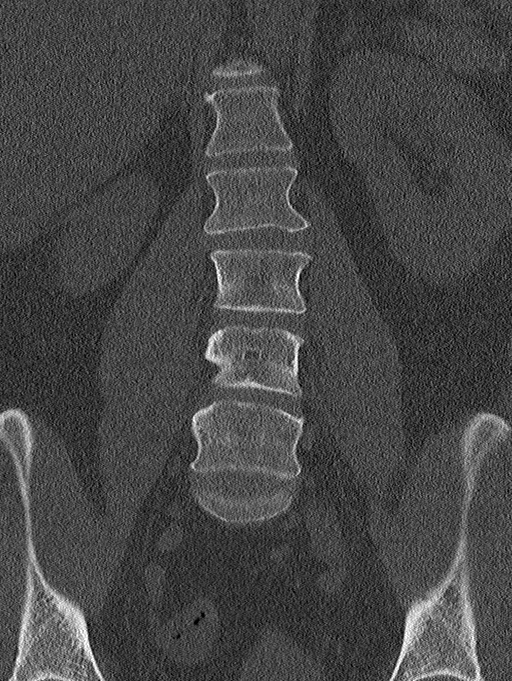
[im 46/77  bone]
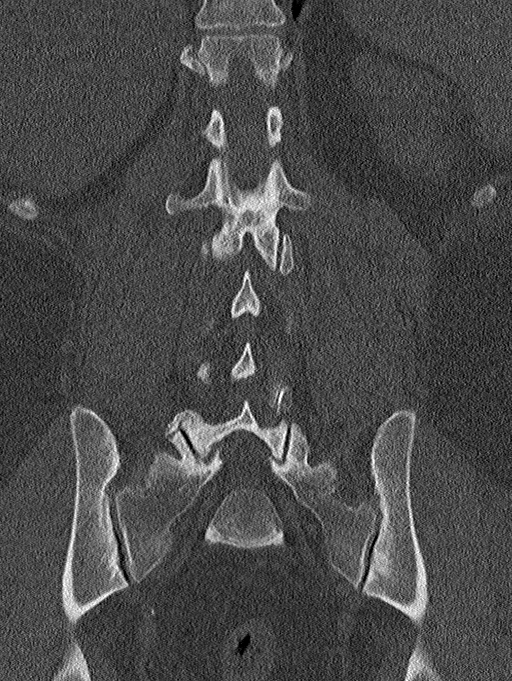

[11 of 33 positions shown; findings below may reference images not displayed]

FINDINGS: Large body habitus results in overall noisy image quality.

SEGMENTATION: For the purposes of this report the last well-formed
intervertebral disc space is reported as L5-S1.

ALIGNMENT: Maintained lumbar lordosis. No malalignment. Mild broad
levoscoliosis could be positional on this nonweightbearing
examination.

VERTEBRAE: Vertebral bodies and posterior elements are intact.
Intervertebral disc heights preserved, mild ventral endplate
spurring. No destructive bony lesions. Moderate sacroiliac
osteoarthrosis.

PARASPINAL AND OTHER SOFT TISSUES: Nonacute. Status post
cholecystectomy. IUD in central uterus.

DISC LEVELS:

T12-L1 through L2-3: No disc bulge, canal stenosis nor neural
foraminal narrowing.

L3-4: Small broad-based disc bulge, mild facet arthropathy without
canal stenosis. Mild LEFT neural foraminal narrowing.

L4-5: Small broad-based disc bulge. Moderate to severe RIGHT,
moderate LEFT facet arthropathy without canal stenosis or neural
foraminal narrowing.

L5-S1: No disc bulge. Severe RIGHT, moderate to severe LEFT facet
arthropathy. No canal stenosis or neural foraminal narrowing.
IMPRESSION: 1. No fracture or malalignment.
2. Degenerative change of the lumbar spine predominately on the
basis of facet arthropathy. Severe RIGHT L5-S1 facet arthropathy.
3. No canal stenosis.  Mild LEFT L3-4 neural foraminal narrowing.
4. Moderate sacroiliac osteoarthrosis.
